# Patient Record
Sex: Female | Born: 2005 | Race: Black or African American | Hispanic: No | Marital: Single | Smoking: Never smoker
Health system: Southern US, Community
[De-identification: ages and names within clinical notes are randomized; demographics above are authoritative.]

## PROBLEM LIST (undated history)

## (undated) DIAGNOSIS — Z789 Other specified health status: Secondary | ICD-10-CM

---
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
## 2025-01-17 ENCOUNTER — Other Ambulatory Visit: Payer: Self-pay

## 2025-01-17 ENCOUNTER — Encounter (HOSPITAL_BASED_OUTPATIENT_CLINIC_OR_DEPARTMENT_OTHER): Payer: Self-pay | Admitting: Emergency Medicine

## 2025-01-17 DIAGNOSIS — S70212A Abrasion, left hip, initial encounter: Secondary | ICD-10-CM | POA: Insufficient documentation

## 2025-01-17 DIAGNOSIS — Y92410 Unspecified street and highway as the place of occurrence of the external cause: Secondary | ICD-10-CM | POA: Insufficient documentation

## 2025-01-17 DIAGNOSIS — S70211A Abrasion, right hip, initial encounter: Secondary | ICD-10-CM | POA: Insufficient documentation

## 2025-01-17 DIAGNOSIS — F419 Anxiety disorder, unspecified: Secondary | ICD-10-CM | POA: Insufficient documentation

## 2025-01-17 NOTE — ED Notes (Signed)
 Refusing urine preg at this time.
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
## 2025-01-18 ENCOUNTER — Other Ambulatory Visit: Payer: Self-pay

## 2025-01-18 ENCOUNTER — Emergency Department (HOSPITAL_COMMUNITY)
Admission: EM | Admit: 2025-01-18 | Discharge: 2025-01-18 | Disposition: A | Discharge status: Home / Self Care | Attending: Emergency Medicine | Admitting: Emergency Medicine

## 2025-01-18 ENCOUNTER — Emergency Department (HOSPITAL_BASED_OUTPATIENT_CLINIC_OR_DEPARTMENT_OTHER)
Admission: EM | Admit: 2025-01-18 | Discharge: 2025-01-18 | Disposition: A | Discharge status: Home / Self Care | Payer: Self-pay | Attending: Emergency Medicine | Admitting: Emergency Medicine

## 2025-01-18 ENCOUNTER — Encounter (HOSPITAL_COMMUNITY): Payer: Self-pay

## 2025-01-18 ENCOUNTER — Emergency Department (HOSPITAL_COMMUNITY)

## 2025-01-18 MED ORDER — CYCLOBENZAPRINE HCL 5 MG PO TABS: 5.0000 mg | ORAL_TABLET | Freq: Two times a day (BID) | ORAL | 0 refills | Status: AC | PRN

## 2025-01-18 MED ORDER — CYCLOBENZAPRINE HCL 5 MG PO TABS
5.0000 mg | ORAL_TABLET | Freq: Once | ORAL | Status: AC
  Administered 2025-01-18: 5 mg via ORAL
  Filled 2025-01-18: qty 1

## 2025-01-18 MED ORDER — IBUPROFEN 600 MG PO TABS: 600.0000 mg | ORAL_TABLET | Freq: Four times a day (QID) | ORAL | 0 refills | Status: AC | PRN

## 2025-01-18 MED ORDER — IBUPROFEN 400 MG PO TABS
400.0000 mg | ORAL_TABLET | Freq: Once | ORAL | Status: AC
  Administered 2025-01-18: 400 mg via ORAL
  Filled 2025-01-18: qty 1
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
## 2025-01-18 NOTE — ED Notes (Signed)
 Patient refusing urine pregnancy and xrays.
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
